# Patient Record
Sex: Female | Born: 2000 | Hispanic: No | Marital: Single | Smoking: Never smoker
Health system: Southern US, Community
[De-identification: ages and names within clinical notes are randomized; demographics above are authoritative.]

## PROBLEM LIST (undated history)

## (undated) DIAGNOSIS — R87619 Unspecified abnormal cytological findings in specimens from cervix uteri: Secondary | ICD-10-CM

## (undated) DIAGNOSIS — S82892A Other fracture of left lower leg, initial encounter for closed fracture: Secondary | ICD-10-CM

## (undated) HISTORY — DX: Other fracture of left lower leg, initial encounter for closed fracture: S82.892A

## (undated) HISTORY — PX: WISDOM TOOTH EXTRACTION: SHX21

## (undated) HISTORY — PX: OTHER SURGICAL HISTORY: SHX169

## (undated) HISTORY — DX: Unspecified abnormal cytological findings in specimens from cervix uteri: R87.619

---
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
## 2019-05-14 ENCOUNTER — Ambulatory Visit (INDEPENDENT_AMBULATORY_CARE_PROVIDER_SITE_OTHER): Payer: 59 | Admitting: Obstetrics and Gynecology

## 2019-05-14 ENCOUNTER — Encounter: Payer: Self-pay | Admitting: Obstetrics and Gynecology

## 2019-05-14 ENCOUNTER — Other Ambulatory Visit: Payer: Self-pay

## 2019-05-14 VITALS — BP 116/74 | Ht 68.0 in | Wt 166.0 lb

## 2019-05-14 DIAGNOSIS — Z30431 Encounter for routine checking of intrauterine contraceptive device: Secondary | ICD-10-CM

## 2019-05-14 DIAGNOSIS — T8332XA Displacement of intrauterine contraceptive device, initial encounter: Secondary | ICD-10-CM | POA: Diagnosis not present

## 2019-05-14 DIAGNOSIS — Z01419 Encounter for gynecological examination (general) (routine) without abnormal findings: Secondary | ICD-10-CM

## 2019-05-14 DIAGNOSIS — Z113 Encounter for screening for infections with a predominantly sexual mode of transmission: Secondary | ICD-10-CM | POA: Diagnosis not present

## 2019-05-14 NOTE — Progress Notes (Signed)
Lauren Wu January 05, 2001 [id number]  SUBJECTIVE:  19 y.o. G0P0000 female for annual routine gynecologic exam.  She has a [name] in place since 04/2017.  The period is getting a little more frequent, light flow for one day, and then a little spotting, enough to need a tampon, but much better than her periods used to be prior to the IUD.  She does request STD screening, although she has no specific concerns and is with her same sexual partner.  She has no gynecologic concerns.  She is not having any pain in the pelvic area.   Current Outpatient Medications  Medication Sig Dispense Refill  . escitalopram (LEXAPRO) 10 MG tablet Take 10 mg by mouth daily.    Marland Kitchen levonorgestrel (MIRENA) 20 MCG/24HR IUD 1 each by Intrauterine route once.    . Multiple Vitamin (MULTIVITAMIN) tablet Take 1 tablet by mouth daily.    . nitrofurantoin (MACRODANTIN) 100 MG capsule Take 100 mg by mouth 4 (four) times daily. With intercourse     No current facility-administered medications for this visit.   Allergies: Patient has no known allergies.  No LMP recorded. (Menstrual status: IUD).  Past medical history,surgical history, problem list, medications, allergies, family history and social history were all reviewed and documented as reviewed in the EPIC chart.  ROS:  Feeling well. No dyspnea or chest pain on exertion.  No abdominal pain, change in bowel habits, black or bloody stools.  No urinary tract symptoms. GYN ROS: normal menses, no abnormal bleeding, pelvic pain or discharge, no breast pain or new or enlarging lumps on self exam. No neurological complaints.   OBJECTIVE:  BP 116/74   Ht 5\' 8"  (1.727 m)   Wt 166 lb (75.3 kg)   BMI 25.24 kg/m  The patient appears well, alert, oriented x 3, in no distress. ENT normal.  Neck supple. No cervical or supraclavicular adenopathy or thyromegaly.  Lungs are clear, good air entry, no wheezes, rhonchi or rales. S1 and S2 normal, no murmurs, regular rate and  rhythm.  Abdomen soft without tenderness, guarding, mass or organomegaly.  Neurological is normal, no focal findings. BREAST EXAM: patient declines PELVIC EXAM: VULVA: normal appearing vulva with no masses, tenderness or lesions, VAGINA: normal appearing vagina with normal color and discharge, no lesions, CERVIX: normal appearing cervix without discharge or lesions, IUD strings not visualized even with the assistance of the endocervical Cytobrush strings were not able to be retrieved, UTERUS: uterus is normal size, shape, consistency and nontender, ADNEXA: normal adnexa in size, nontender and no masses  Bedside transvaginal ultrasound is performed with visualization of the IUD in an appropriate location within the endometrial cavity.  There is no evidence of malpositioning.  Chaperone: Caryn Bee present during the examination and ultrasound  ASSESSMENT:  19 y.o. G0P0000 here for annual gynecologic exam  PLAN:   1. Contraception: [name] in place since 04/2017.  We discussed that it is normal for there to be a change in menstrual pattern the longer the IUD is in place.  The strings were not visualized on visual exam today, so a bedside transvaginal ultrasound was performed confirming intrauterine location of the device. 2. Pap smear will be indicated at age 46. 47. Gardasil series reportedly previously received. 4. Breast health.  Exam not performed today per patient request, recommend breast self-awareness. 5. STI screening.  Gonorrhea chlamydia ThinPrep, blood tests for hepatitis B surface antigen, hepatitis C antibody, RPR, HIV 1 and 2 screening 6. Health maintenance.  No  signs or symptoms to indicate need for routine screening labs at this time, will plan on this when she is in her early 17s.  An additional 15 minutes were spent in this encounter discussing her IUD concerns and evaluating positioning using bedside ultrasound.  Return annually or sooner, prn.  Theresia Majors MD   05/14/19

## 2019-05-14 NOTE — Patient Instructions (Addendum)
The IUD is in a normal location in the uterus based on the ultrasound today.  The strings were not retrievable so when it comes time to remove the device, it may be a little more difficult than normal but we run into this not uncommonly. We will let you know about the results from today's tests when available.

## 2019-05-15 ENCOUNTER — Encounter: Payer: 59 | Admitting: Obstetrics and Gynecology

## 2019-05-15 LAB — GC PROBE AMP THINPREP: N. gonorrhoeae RNA, TMA: NOT DETECTED

## 2019-05-15 LAB — CHLAMYDIA PROBE AMP THINPREP: C. trachomatis RNA, TMA: NOT DETECTED

## 2019-05-18 LAB — HIV ANTIBODY (ROUTINE TESTING W REFLEX): HIV 1&2 Ab, 4th Generation: NONREACTIVE

## 2019-05-18 LAB — HEPATITIS C ANTIBODY
Hepatitis C Ab: NONREACTIVE
SIGNAL TO CUT-OFF: 0.03 (ref <1.00)

## 2019-05-18 LAB — HEPATITIS B SURFACE ANTIGEN: Hepatitis B Surface Ag: NONREACTIVE

## 2019-05-18 LAB — RPR: RPR Ser Ql: NONREACTIVE

## 2020-05-16 ENCOUNTER — Encounter: Payer: 59 | Admitting: Obstetrics and Gynecology

## 2020-05-17 ENCOUNTER — Encounter: Payer: Self-pay | Admitting: Obstetrics and Gynecology

## 2020-05-17 ENCOUNTER — Other Ambulatory Visit: Payer: Self-pay

## 2020-05-17 ENCOUNTER — Ambulatory Visit: Payer: 59 | Admitting: Obstetrics and Gynecology

## 2020-05-17 VITALS — BP 108/70 | HR 80 | Resp 18 | Ht 68.5 in | Wt 135.4 lb

## 2020-05-17 DIAGNOSIS — Z113 Encounter for screening for infections with a predominantly sexual mode of transmission: Secondary | ICD-10-CM | POA: Diagnosis not present

## 2020-05-17 DIAGNOSIS — Z01419 Encounter for gynecological examination (general) (routine) without abnormal findings: Secondary | ICD-10-CM | POA: Diagnosis not present

## 2020-05-17 DIAGNOSIS — N390 Urinary tract infection, site not specified: Secondary | ICD-10-CM | POA: Diagnosis not present

## 2020-05-17 NOTE — Progress Notes (Signed)
Lauren Wu 03-20-2001 [id number]  SUBJECTIVE:  20 y.o. G0P0000 female for annual routine gynecologic exam.  She has a [name] in place since 04/2017.  Regular 3 to 4-day light periods.  She does request STD screening, although she has no specific concerns at this time.  She has no gynecologic concerns.  Has previously had a history of recurrent UTIs, she was [facility] and she said there was some glucose in urine and questionable UTI, she is not having any significant symptoms at this time.  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   Current Outpatient Medications  Medication Sig Dispense Refill  . escitalopram (LEXAPRO) 10 MG tablet Take 10 mg by mouth daily.    Marland Kitchen levonorgestrel (MIRENA) 20 MCG/24HR IUD 1 each by Intrauterine route once.    . Multiple Vitamin (MULTIVITAMIN) tablet Take 1 tablet by mouth daily.    Marland Kitchen tretinoin (RETIN-A) 0.05 % cream Apply to affected area every other night    . nitrofurantoin (MACRODANTIN) 100 MG capsule Take 100 mg by mouth 4 (four) times daily. With intercourse (Patient not taking: Reported on 05/17/2020)     No current facility-administered medications for this visit.   Allergies: Patient has no known allergies.  Patient's last menstrual period was 05/05/2020 (approximate).  Past medical history,surgical history, problem list, medications, allergies, family history and social history were all reviewed and documented as reviewed in the EPIC chart.  ROS: Positives and negatives as reviewed in HPI.   OBJECTIVE:  BP 108/70   Pulse 80   Resp 18   Ht 5' 8.5" (1.74 m)   Wt 135 lb 6.4 oz (61.4 kg)   LMP 05/05/2020 (Approximate)   BMI 20.29 kg/m  The patient appears well, alert, oriented, in no distress. ENT normal.  Neck supple. No cervical or supraclavicular adenopathy or thyromegaly.  Lungs are clear, good air entry, no wheezes, rhonchi or rales. S1 and S2 normal, no murmurs, regular rate and rhythm.   Abdomen soft without tenderness, guarding, mass or organomegaly.  Neurological is normal, no focal findings. BREAST EXAM: Normal breasts bilaterally, normal areola and nipple complex.  No skin dimpling or retractions.  No concerning masses.  No axillary lymphadenopathy. PELVIC EXAM: VULVA: normal appearing vulva with no masses, tenderness or lesions, VAGINA: normal appearing vagina with normal color and discharge, no lesions, CERVIX: normal appearing cervix without discharge or lesions, IUD strings visualized 1 cm outside of the external os, UTERUS: uterus is normal size, shape, consistency and nontender, ADNEXA: normal adnexa in size, nontender and no masses.  GC/Chlamydia probe obtained.  Chaperone: Amity Bing present during the examination   ASSESSMENT:  20 y.o. G0P0000 here for annual gynecologic exam  PLAN:   1. Contraception: [name] in place since 04/2017.  No concerns at this time.  We were able to visualize the IUD strings today.  Due for removal at 5-year mark. 2. Pap smear will be indicated at age 20. 74. Gardasil series reportedly previously received. 4. Breast health.  Recommend breast self-awareness/SBE. 5. STI screening.  Gonorrhea-chlamydia probe collected, serum screening for hepatitis B surface antigen, hepatitis C antibody, RPR, HIV 1 and 2 screening 6.  History of recurrent UTIs.  She does have an established relationship with urologist near her school in [place].  She will plan to follow-up there.  We will run a UA today given the report of possible features suggestive of UTI, and glucosuria on her recent urine testing done at  her school. 7. Health maintenance.  No signs or symptoms to indicate need for routine screening labs at this time.   Return annually or sooner, prn.  Theresia Majors MD  05/17/20

## 2020-05-18 LAB — HIV ANTIBODY (ROUTINE TESTING W REFLEX): HIV 1&2 Ab, 4th Generation: NONREACTIVE

## 2020-05-18 LAB — RPR: RPR Ser Ql: NONREACTIVE

## 2020-05-18 LAB — HEPATITIS B SURFACE ANTIGEN: Hepatitis B Surface Ag: NONREACTIVE

## 2020-05-18 LAB — C. TRACHOMATIS/N. GONORRHOEAE RNA
C. trachomatis RNA, TMA: NOT DETECTED
N. gonorrhoeae RNA, TMA: NOT DETECTED

## 2020-05-18 LAB — HEPATITIS C ANTIBODY
Hepatitis C Ab: NONREACTIVE
SIGNAL TO CUT-OFF: 0.02 (ref <1.00)

## 2020-05-19 LAB — URINALYSIS, COMPLETE W/RFL CULTURE
Bilirubin Urine: NEGATIVE
Glucose, UA: NEGATIVE
Hgb urine dipstick: NEGATIVE
Hyaline Cast: NONE SEEN /LPF
Ketones, ur: NEGATIVE
Leukocyte Esterase: NEGATIVE
Nitrites, Initial: NEGATIVE
Protein, ur: NEGATIVE
RBC / HPF: NONE SEEN /HPF (ref 0–2)
Specific Gravity, Urine: 1.015 (ref 1.001–1.03)
pH: 7 (ref 5.0–8.0)

## 2020-05-19 LAB — URINE CULTURE
MICRO NUMBER:: 11508404
SPECIMEN QUALITY:: ADEQUATE

## 2020-05-19 LAB — CULTURE INDICATED

## 2021-06-01 ENCOUNTER — Ambulatory Visit: Payer: 59 | Admitting: Obstetrics & Gynecology

## 2021-06-26 ENCOUNTER — Other Ambulatory Visit: Payer: Self-pay

## 2021-06-26 ENCOUNTER — Ambulatory Visit (INDEPENDENT_AMBULATORY_CARE_PROVIDER_SITE_OTHER): Payer: 59 | Admitting: Obstetrics & Gynecology

## 2021-06-26 ENCOUNTER — Encounter: Payer: Self-pay | Admitting: Obstetrics & Gynecology

## 2021-06-26 ENCOUNTER — Other Ambulatory Visit (HOSPITAL_COMMUNITY)
Admission: RE | Admit: 2021-06-26 | Discharge: 2021-06-26 | Disposition: A | Discharge status: Home / Self Care | Payer: 59 | Source: Ambulatory Visit | Attending: Obstetrics & Gynecology | Admitting: Obstetrics & Gynecology

## 2021-06-26 VITALS — BP 118/74 | HR 76 | Ht 67.75 in | Wt 166.0 lb

## 2021-06-26 DIAGNOSIS — Z01419 Encounter for gynecological examination (general) (routine) without abnormal findings: Secondary | ICD-10-CM | POA: Diagnosis present

## 2021-06-26 DIAGNOSIS — N898 Other specified noninflammatory disorders of vagina: Secondary | ICD-10-CM

## 2021-06-26 DIAGNOSIS — N3090 Cystitis, unspecified without hematuria: Secondary | ICD-10-CM

## 2021-06-26 DIAGNOSIS — Z113 Encounter for screening for infections with a predominantly sexual mode of transmission: Secondary | ICD-10-CM | POA: Insufficient documentation

## 2021-06-26 DIAGNOSIS — Z30431 Encounter for routine checking of intrauterine contraceptive device: Secondary | ICD-10-CM | POA: Diagnosis not present

## 2021-06-26 LAB — WET PREP FOR TRICH, YEAST, CLUE

## 2021-06-26 MED ORDER — TINIDAZOLE 500 MG PO TABS: 1000.0000 mg | ORAL_TABLET | Freq: Two times a day (BID) | ORAL | 0 refills | Status: AC

## 2021-06-26 NOTE — Progress Notes (Addendum)
? ? ?Lauren Wu 25-Jul-2000 [id number] ? ? ?History:    21 y.o. G0 Single.  Graduating this May from [employer]. ? ?RP:  Established patient presenting for annual gyn exam  ? ?HPI: Well on [name] in place since 04/2017.  No BTB.  No pelvic pain.  Vaginal itching and odor recently.  Treated for BV a few weeks ago.  H/O recurrent Cystitis, seen by Urology last year.  Had Antibioprophylaxis prescribed to take when sexually active.  No UTI since then. Pap reflex/Gono-Chlam today.  Full STI screen desired.  Gardasil series reportedly previously received. Breasts normal. BMI 25.43.  Good fitness. ? ? ?Past medical history,surgical history, family history and social history were all reviewed and documented in the EPIC chart. ? ?Gynecologic History ?Patient's last menstrual period was 06/16/2021 (exact date). ? ?Obstetric History ?OB History  ?Gravida Para Term Preterm AB Living  ?0 0 0 0 0 0  ?SAB IAB Ectopic Multiple Live Births  ?0 0 0 0 0  ? ? ? ?ROS: A ROS was performed and pertinent positives and negatives are included in the history. ? GENERAL: No fevers or chills. HEENT: No change in vision, no earache, sore throat or sinus congestion. NECK: No pain or stiffness. CARDIOVASCULAR: No chest pain or pressure. No palpitations. PULMONARY: No shortness of breath, cough or wheeze. GASTROINTESTINAL: No abdominal pain, nausea, vomiting or diarrhea, melena or bright red blood per rectum. GENITOURINARY: No urinary frequency, urgency, hesitancy or dysuria. MUSCULOSKELETAL: No joint or muscle pain, no back pain, no recent trauma. DERMATOLOGIC: No rash, no itching, no lesions. ENDOCRINE: No polyuria, polydipsia, no heat or cold intolerance. No recent change in weight. HEMATOLOGICAL: No anemia or easy bruising or bleeding. NEUROLOGIC: No headache, seizures, numbness, tingling or weakness. PSYCHIATRIC: No depression, no loss of interest in normal activity or change in sleep pattern.  ?  ? ?Exam: ? ? ?BP 118/74   Pulse 76    Ht 5' 7.75" (1.721 m)   Wt 166 lb (75.3 kg)   LMP 06/16/2021 (Exact Date)   SpO2 99%   BMI 25.43 kg/m?  ? ?Body mass index is 25.43 kg/m?. ? ?General appearance : Well developed well nourished female. No acute distress ?HEENT: Eyes: no retinal hemorrhage or exudates,  Neck supple, trachea midline, no carotid bruits, no thyroidmegaly ?Lungs: Clear to auscultation, no rhonchi or wheezes, or rib retractions  ?Heart: Regular rate and rhythm, no murmurs or gallops ?Breast:Examined in sitting and supine position were symmetrical in appearance, no palpable masses or tenderness,  no skin retraction, no nipple inversion, no nipple discharge, no skin discoloration, no axillary or supraclavicular lymphadenopathy ?Abdomen: no palpable masses or tenderness, no rebound or guarding ?Extremities: no edema or skin discoloration or tenderness ? ?Pelvic: Vulva: Normal ?            Vagina: No gross lesions.  Increased discharge, wet prep done. ? Cervix: No gross lesions or discharge.  [facility].  Pap/Gono-Chlam done. ? Uterus  AV, normal size, shape and consistency, non-tender and mobile ? Adnexa  Without masses or tenderness ? Anus: Normal ? ?Wet prep:  Clue cells and odor present ?U/A Yellow slightly cloudy, Pro Neg, Nit Neg, WBC 0-5, RBC Neg, Bacteria Moderate, few Clue cells.  Pending U. Culture. ? ? ?Assessment/Plan:  21 y.o. female for annual exam  ? ?1. Encounter for routine gynecological examination with Papanicolaou smear of cervix ?Well on [name] in place since 04/2017.  No BTB.  No  pelvic pain.  Vaginal itching and odor recently.  Treated for BV a few weeks ago.  H/O recurrent Cystitis, seen by Urology last year.  Had Antibioprophylaxis prescribed to take when sexually active.  No UTI since then. Pap reflex/Gono-Chlam today.  Full STI screen desired.  Gardasil series reportedly previously received. Breasts normal. BMI 25.43.  Good fitness. ?- Cytology - PAP( [place]) ? ?2. Encounter for routine  checking of intrauterine contraceptive device (IUD) ?Well on [name] x 04/2017.  IUD in good position.  No CI to continue.   ? ?3. Screen for STD (sexually transmitted disease) ?Strict condom use recommended. ?- HIV antibody (with reflex) ?- RPR ?- Hepatitis B Surface AntiGEN ?- Hepatitis C Antibody ?- Cytology - PAP( [place])- Gono-Chlam ? ?4. Vaginal discharge ?BV confirmed on Wet prep.  Will treat with Tinidazole.  Usage reviewed and prescription sent to pharmacy. ?- WET PREP FOR TRICH, YEAST, CLUE ? ?5. H/O Recurrent cystitis ?U/A mildly abnormal, probably d/t BV.  Treating BV.  Will wait on Urine Culture results. ?- Urinalysis,Complete w/RFL Culture ? ?Other orders ?- SPIRONOLACTONE PO; Take 10 mg by mouth.  ?- tinidazole (TINDAMAX) 500 MG tablet; Take 2 tablets (1,000 mg total) by mouth 2 (two) times daily for 2 days.  ? ?Genia Del MD, 10:07 AM 06/26/2021 ? ?  ?

## 2021-06-27 LAB — HEPATITIS B SURFACE ANTIGEN: Hepatitis B Surface Ag: NONREACTIVE

## 2021-06-27 LAB — RPR: RPR Ser Ql: NONREACTIVE

## 2021-06-27 LAB — HEPATITIS C ANTIBODY
Hepatitis C Ab: NONREACTIVE
SIGNAL TO CUT-OFF: 0.08 (ref <1.00)

## 2021-06-27 LAB — HIV ANTIBODY (ROUTINE TESTING W REFLEX): HIV 1&2 Ab, 4th Generation: NONREACTIVE

## 2021-06-29 LAB — URINE CULTURE
MICRO NUMBER:: 13152046
SPECIMEN QUALITY:: ADEQUATE

## 2021-06-29 LAB — URINALYSIS, COMPLETE W/RFL CULTURE
Bilirubin Urine: NEGATIVE
Casts: NONE SEEN /LPF
Crystals: NONE SEEN /HPF
Glucose, UA: NEGATIVE
Hgb urine dipstick: NEGATIVE
Hyaline Cast: NONE SEEN /LPF
Ketones, ur: NEGATIVE
Leukocyte Esterase: NEGATIVE
Nitrites, Initial: NEGATIVE
Protein, ur: NEGATIVE
RBC / HPF: NONE SEEN /HPF (ref 0–2)
Specific Gravity, Urine: 1.02 (ref 1.001–1.035)
Yeast: NONE SEEN /HPF
pH: 7.5 (ref 5.0–8.0)

## 2021-06-29 LAB — CULTURE INDICATED

## 2021-07-03 LAB — CYTOLOGY - PAP
Chlamydia: NEGATIVE
Comment: NEGATIVE
Comment: NEGATIVE
Comment: NORMAL
Diagnosis: UNDETERMINED — AB
High risk HPV: POSITIVE — AB
Neisseria Gonorrhea: NEGATIVE

## 2021-07-04 ENCOUNTER — Other Ambulatory Visit: Payer: Self-pay

## 2021-07-04 MED ORDER — NITROFURANTOIN MONOHYD MACRO 100 MG PO CAPS: 100.0000 mg | ORAL_CAPSULE | Freq: Two times a day (BID) | ORAL | 0 refills | Status: DC

## 2021-08-01 ENCOUNTER — Telehealth: Payer: Self-pay | Admitting: *Deleted

## 2021-08-01 NOTE — Telephone Encounter (Signed)
Dr. Dellis Filbert -please review PAP dated 06/26/21.  ?

## 2021-08-07 NOTE — Telephone Encounter (Signed)
Spoke with patient, advised of PAP results per Dr. Seymour Bars, questions answered. OV scheduled for 12/12/21 at 1:30 pm for repeat PAP. Patient verbalizes understanding and is agreeable.  ? ?6 mo recall placed.  ? ?Encounter closed.  ?

## 2021-08-07 NOTE — Telephone Encounter (Signed)
Lauren Bruins, MD  You 4 days ago  ? ?Given that she is 21 yo with ASCUS/HPV HR Pos, repeat Pap in 6 months.   ? ?

## 2021-12-12 ENCOUNTER — Encounter: Payer: Self-pay | Admitting: Obstetrics & Gynecology

## 2021-12-12 ENCOUNTER — Other Ambulatory Visit (HOSPITAL_COMMUNITY)
Admission: RE | Admit: 2021-12-12 | Discharge: 2021-12-12 | Disposition: A | Discharge status: Home / Self Care | Payer: 59 | Source: Ambulatory Visit | Attending: Obstetrics & Gynecology | Admitting: Obstetrics & Gynecology

## 2021-12-12 ENCOUNTER — Ambulatory Visit (INDEPENDENT_AMBULATORY_CARE_PROVIDER_SITE_OTHER): Payer: 59 | Admitting: Obstetrics & Gynecology

## 2021-12-12 VITALS — BP 120/76 | HR 68 | Resp 16

## 2021-12-12 DIAGNOSIS — R87610 Atypical squamous cells of undetermined significance on cytologic smear of cervix (ASC-US): Secondary | ICD-10-CM | POA: Diagnosis not present

## 2021-12-12 DIAGNOSIS — N898 Other specified noninflammatory disorders of vagina: Secondary | ICD-10-CM

## 2021-12-12 DIAGNOSIS — Z113 Encounter for screening for infections with a predominantly sexual mode of transmission: Secondary | ICD-10-CM

## 2021-12-12 DIAGNOSIS — R87810 Cervical high risk human papillomavirus (HPV) DNA test positive: Secondary | ICD-10-CM | POA: Insufficient documentation

## 2021-12-12 LAB — WET PREP FOR TRICH, YEAST, CLUE

## 2021-12-12 MED ORDER — TINIDAZOLE 500 MG PO TABS: 1000.0000 mg | ORAL_TABLET | Freq: Two times a day (BID) | ORAL | 0 refills | Status: AC

## 2021-12-12 NOTE — Progress Notes (Signed)
    Dlisa Barnwell Suen 03/28/2001 [id number]        21 y.o.  G0P0000   RP: Repeat Pap, ASCUS/HPV HR Pos 06/26/21  HPI: ASCUS/HPV HR Pos 06/26/21. Full STI screen otherwise Neg on 06/26/21. Gardasil series reportedly previously received. C/O vaginal discharge with odor.  Well on [name] in place since 04/2017.  No BTB.  No pelvic pain. H/O recurrent Cystitis, seen by Urology last year.  Had Antibioprophylaxis prescribed to take when sexually active.  No UTI since then.     OB History  Gravida Para Term Preterm AB Living  0 0 0 0 0 0  SAB IAB Ectopic Multiple Live Births  0 0 0 0 0    Past medical history,surgical history, problem list, medications, allergies, family history and social history were all reviewed and documented in the EPIC chart.   Directed ROS with pertinent positives and negatives documented in the history of present illness/assessment and plan.  Exam:  Vitals:   12/12/21 1333  BP: 120/76  Pulse: 68  Resp: 16   General appearance:  Normal  Abdomen: Normal  Gynecologic exam: Vulva normal.  Speculum:  Cervix normal.  IUD strings visible at EO.  Pap/HPV HR, Gono-Chlam done. Increased discharge.  Wet prep done.    Wet Prep:  Clue cells present with odor   Assessment/Plan:  21 y.o. G0  1. ASCUS with positive high risk HPV cervical ASCUS/HPV HR Pos 06/26/21. Full STI screen otherwise Neg on 06/26/21. Gardasil series reportedly previously received. C/O vaginal discharge with odor.  Well on [name] in place since 04/2017.  No BTB.  No pelvic pain. H/O recurrent Cystitis, seen by Urology last year.  Had Antibioprophylaxis prescribed to take when sexually active.  No UTI since then.   Pap/HPV HR today.  Management per results. - Cytology - PAP( [place])  2. Screen for STD (sexually transmitted disease) - Cytology - PAP( [place])  3. Vaginal discharge Bacterial Vaginosis.  Will treat with Tinidazole. Usage reviewed and prescription sent to pharmacy.   Probiotics/Boric Acid recommended for prophylaxis. - WET PREP FOR TRICH, YEAST, CLUE  Other orders - tinidazole (TINDAMAX) 500 MG tablet; Take 2 tablets (1,000 mg total) by mouth 2 (two) times daily for 2 days.   Genia Del MD, 1:38 PM 12/12/2021

## 2021-12-18 LAB — CYTOLOGY - PAP
Chlamydia: NEGATIVE
Comment: NEGATIVE
Comment: NEGATIVE
Comment: NORMAL
Diagnosis: NEGATIVE
Diagnosis: REACTIVE
High risk HPV: NEGATIVE
Neisseria Gonorrhea: NEGATIVE

## 2022-06-28 ENCOUNTER — Ambulatory Visit (INDEPENDENT_AMBULATORY_CARE_PROVIDER_SITE_OTHER): Payer: 59 | Admitting: Obstetrics & Gynecology

## 2022-06-28 ENCOUNTER — Other Ambulatory Visit (HOSPITAL_COMMUNITY)
Admission: RE | Admit: 2022-06-28 | Discharge: 2022-06-28 | Disposition: A | Discharge status: Home / Self Care | Payer: 59 | Source: Ambulatory Visit | Attending: Obstetrics & Gynecology | Admitting: Obstetrics & Gynecology

## 2022-06-28 ENCOUNTER — Encounter: Payer: Self-pay | Admitting: Obstetrics & Gynecology

## 2022-06-28 VITALS — BP 120/80 | HR 74 | Ht 67.75 in | Wt 190.0 lb

## 2022-06-28 DIAGNOSIS — Z01419 Encounter for gynecological examination (general) (routine) without abnormal findings: Secondary | ICD-10-CM | POA: Insufficient documentation

## 2022-06-28 DIAGNOSIS — R87810 Cervical high risk human papillomavirus (HPV) DNA test positive: Secondary | ICD-10-CM | POA: Diagnosis present

## 2022-06-28 DIAGNOSIS — R87610 Atypical squamous cells of undetermined significance on cytologic smear of cervix (ASC-US): Secondary | ICD-10-CM

## 2022-06-28 DIAGNOSIS — Z113 Encounter for screening for infections with a predominantly sexual mode of transmission: Secondary | ICD-10-CM

## 2022-06-28 DIAGNOSIS — Z30431 Encounter for routine checking of intrauterine contraceptive device: Secondary | ICD-10-CM

## 2022-06-28 DIAGNOSIS — N760 Acute vaginitis: Secondary | ICD-10-CM | POA: Diagnosis not present

## 2022-06-28 DIAGNOSIS — N898 Other specified noninflammatory disorders of vagina: Secondary | ICD-10-CM

## 2022-06-28 LAB — WET PREP FOR TRICH, YEAST, CLUE

## 2022-06-28 MED ORDER — TINIDAZOLE 500 MG PO TABS: 1000.0000 mg | ORAL_TABLET | Freq: Two times a day (BID) | ORAL | 2 refills | Status: AC

## 2022-06-28 MED ORDER — FLUCONAZOLE 150 MG PO TABS: 150.0000 mg | ORAL_TABLET | ORAL | 2 refills | Status: AC

## 2022-06-28 NOTE — Progress Notes (Signed)
Lauren Wu 2000-08-31 [id number]   History:    22 y.o.  G0 Single/stable partner.  Graduated from [employer].     RP:  Established patient presenting for annual gyn exam    HPI: Well on [name] in place since 04/2017.  Light spaced menses.  No pelvic pain. Increased vaginal discharge.  Pap Neg/HPV HR Neg/Gono-Chlam Neg in 12/2021.  Pap ASCUS/HPV HR Pos in 06/2021.  ASCUS/HPV HR Pos 06/26/21. Pap/HPV HR/Gono-Chlam today.  Gardasil series reportedly previously received.  Breasts normal. BMI increased to 29.1.  Good fitness.  Health labs with Fam MD.  Will repeat a TSH in 6 months as it was wnl, but higher than before.     Past medical history,surgical history, family history and social history were all reviewed and documented in the EPIC chart.  Gynecologic History No LMP recorded. (Menstrual status: IUD).  Obstetric History OB History  Gravida Para Term Preterm AB Living  0 0 0 0 0 0  SAB IAB Ectopic Multiple Live Births  0 0 0 0 0     ROS: A ROS was performed and pertinent positives and negatives are included in the history. GENERAL: No fevers or chills. HEENT: No change in vision, no earache, sore throat or sinus congestion. NECK: No pain or stiffness. CARDIOVASCULAR: No chest pain or pressure. No palpitations. PULMONARY: No shortness of breath, cough or wheeze. GASTROINTESTINAL: No abdominal pain, nausea, vomiting or diarrhea, melena or bright red blood per rectum. GENITOURINARY: No urinary frequency, urgency, hesitancy or dysuria. MUSCULOSKELETAL: No joint or muscle pain, no back pain, no recent trauma. DERMATOLOGIC: No rash, no itching, no lesions. ENDOCRINE: No polyuria, polydipsia, no heat or cold intolerance. No recent change in weight. HEMATOLOGICAL: No anemia or easy bruising or bleeding. NEUROLOGIC: No headache, seizures, numbness, tingling or weakness. PSYCHIATRIC: No depression, no loss of interest in normal activity or change in sleep pattern.     Exam:   BP  120/80   Pulse 74   Ht 5' 7.75" (1.721 m)   Wt 190 lb (86.2 kg)   BMI 29.10 kg/m   Body mass index is 29.1 kg/m.  General appearance : Well developed well nourished female. No acute distress HEENT: Eyes: no retinal hemorrhage or exudates,  Neck supple, trachea midline, no carotid bruits, no thyroidmegaly Lungs: Clear to auscultation, no rhonchi or wheezes, or rib retractions  Heart: Regular rate and rhythm, no murmurs or gallops Breast:Examined in sitting and supine position were symmetrical in appearance, no palpable masses or tenderness,  no skin retraction, no nipple inversion, no nipple discharge, no skin discoloration, no axillary or supraclavicular lymphadenopathy Abdomen: no palpable masses or tenderness, no rebound or guarding Extremities: no edema or skin discoloration or tenderness  Pelvic: Vulva: Normal             Vagina: No gross lesions.  Increased discharge.  Wet prep done.  Cervix: No gross lesions or discharge.  [facility]UD strings visible at [facility].  Pap/HPV HR/Gono-Chlam done.  Uterus  AV, normal size, shape and consistency, non-tender and mobile  Adnexa  Without masses or tenderness  Anus: Normal  Wet Prep:  Clue cells present with odor and yeast   Assessment/Plan:  22 y.o. female for annual exam   1. Encounter for routine gynecological examination with Papanicolaou smear of cervix Well on [name] in place since 04/2017.  Light spaced menses.  No pelvic pain. Increased vaginal discharge.  Pap Neg/HPV HR Neg/Gono-Chlam Neg in 12/2021.  Pap ASCUS/HPV HR  Pos in 06/2021.  ASCUS/HPV HR Pos 06/26/21. Pap/HPV HR/Gono-Chlam today.  Gardasil series reportedly previously received.  Breasts normal. BMI increased to 29.1.  Good fitness.  Health labs with Fam MD.  Will repeat a TSH in 6 months as it was wnl, but higher than before.  - Cytology - PAP( [place])  2. ASCUS with positive high risk HPV cervical - Cytology - PAP( [place])  3. Encounter for routine checking of  intrauterine contraceptive device (IUD) Well on [name] in place since 04/2017.  Light spaced menses.  No pelvic pain. IUD in good position.  4. Vaginal discharge BV and yeast confirmed by Wet prep.  Will treat with Tinidazole, followed by Fluconazole. - WET PREP FOR [place], YEAST, CLUE  5. Screen for STD (sexually transmitted disease) - HIV antibody (with reflex) - RPR - Hepatitis B Surface AntiGEN - Hepatitis C Antibody  Other orders - spironolactone (ALDACTONE) 50 MG tablet; Take by mouth. - tinidazole (TINDAMAX) 500 MG tablet; Take 2 tablets (1,000 mg total) by mouth 2 (two) times daily for 2 days. - fluconazole (DIFLUCAN) 150 MG tablet; Take 1 tablet (150 mg total) by mouth every other day for 3 doses.   Princess Bruins MD, 10:22 AM

## 2022-06-29 LAB — HEPATITIS B SURFACE ANTIGEN: Hepatitis B Surface Ag: NONREACTIVE

## 2022-06-29 LAB — RPR: RPR Ser Ql: NONREACTIVE

## 2022-06-29 LAB — HIV ANTIBODY (ROUTINE TESTING W REFLEX): HIV 1&2 Ab, 4th Generation: NONREACTIVE

## 2022-06-29 LAB — HEPATITIS C ANTIBODY: Hepatitis C Ab: NONREACTIVE

## 2022-07-04 LAB — CYTOLOGY - PAP
Chlamydia: NEGATIVE
Comment: NEGATIVE
Comment: NEGATIVE
Comment: NORMAL
Diagnosis: NEGATIVE
High risk HPV: NEGATIVE
Neisseria Gonorrhea: NEGATIVE

## 2023-10-25 ENCOUNTER — Ambulatory Visit: Payer: 59 | Admitting: Obstetrics and Gynecology

## 2023-11-06 ENCOUNTER — Encounter: Payer: Self-pay | Admitting: Obstetrics and Gynecology

## 2023-11-06 ENCOUNTER — Other Ambulatory Visit (HOSPITAL_COMMUNITY)
Admission: RE | Admit: 2023-11-06 | Discharge: 2023-11-06 | Disposition: A | Discharge status: Home / Self Care | Source: Ambulatory Visit | Attending: Obstetrics and Gynecology | Admitting: Obstetrics and Gynecology

## 2023-11-06 ENCOUNTER — Ambulatory Visit (INDEPENDENT_AMBULATORY_CARE_PROVIDER_SITE_OTHER): Admitting: Obstetrics and Gynecology

## 2023-11-06 VITALS — BP 116/70 | HR 71 | Temp 97.9°F | Ht 68.25 in | Wt 200.0 lb

## 2023-11-06 DIAGNOSIS — Z124 Encounter for screening for malignant neoplasm of cervix: Secondary | ICD-10-CM

## 2023-11-06 DIAGNOSIS — N898 Other specified noninflammatory disorders of vagina: Secondary | ICD-10-CM

## 2023-11-06 DIAGNOSIS — Z01419 Encounter for gynecological examination (general) (routine) without abnormal findings: Secondary | ICD-10-CM | POA: Diagnosis not present

## 2023-11-06 DIAGNOSIS — Z8742 Personal history of other diseases of the female genital tract: Secondary | ICD-10-CM

## 2023-11-06 DIAGNOSIS — Z87440 Personal history of urinary (tract) infections: Secondary | ICD-10-CM

## 2023-11-06 DIAGNOSIS — R8290 Unspecified abnormal findings in urine: Secondary | ICD-10-CM | POA: Diagnosis not present

## 2023-11-06 DIAGNOSIS — N760 Acute vaginitis: Secondary | ICD-10-CM

## 2023-11-06 DIAGNOSIS — Z113 Encounter for screening for infections with a predominantly sexual mode of transmission: Secondary | ICD-10-CM

## 2023-11-06 DIAGNOSIS — Z1331 Encounter for screening for depression: Secondary | ICD-10-CM | POA: Diagnosis not present

## 2023-11-06 DIAGNOSIS — Z975 Presence of (intrauterine) contraceptive device: Secondary | ICD-10-CM

## 2023-11-06 MED ORDER — METRONIDAZOLE 500 MG PO TABS: 500.0000 mg | ORAL_TABLET | Freq: Two times a day (BID) | ORAL | 0 refills | Status: AC
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
## 2023-11-06 NOTE — Patient Instructions (Signed)

## 2023-11-07 ENCOUNTER — Ambulatory Visit: Payer: Self-pay | Admitting: Obstetrics and Gynecology

## 2023-11-07 DIAGNOSIS — B3731 Acute candidiasis of vulva and vagina: Secondary | ICD-10-CM

## 2023-11-07 LAB — SURESWAB® ADVANCED VAGINITIS PLUS,TMA
C. trachomatis RNA, TMA: NOT DETECTED
CANDIDA SPECIES: DETECTED — AB
Candida glabrata: NOT DETECTED
N. gonorrhoeae RNA, TMA: NOT DETECTED
SURESWAB(R) ADV BACTERIAL VAGINOSIS(BV),TMA: POSITIVE — AB
TRICHOMONAS VAGINALIS (TV),TMA: NOT DETECTED

## 2023-11-07 LAB — HEPATITIS C ANTIBODY: Hepatitis C Ab: NONREACTIVE

## 2023-11-07 LAB — HIV ANTIBODY (ROUTINE TESTING W REFLEX): HIV 1&2 Ab, 4th Generation: NONREACTIVE

## 2023-11-07 LAB — HSV 2 ANTIBODY, IGG: HSV 2 Glycoprotein G Ab, IgG: <0.9 {index}

## 2023-11-07 LAB — HEPATITIS B SURFACE ANTIGEN: Hepatitis B Surface Ag: NONREACTIVE

## 2023-11-07 LAB — RPR: RPR Ser Ql: NONREACTIVE

## 2023-11-08 LAB — URINALYSIS, COMPLETE W/RFL CULTURE
Bilirubin Urine: NEGATIVE
Glucose, UA: NEGATIVE
Hgb urine dipstick: NEGATIVE
Hyaline Cast: NONE SEEN /LPF
Ketones, ur: NEGATIVE
Leukocyte Esterase: NEGATIVE
Nitrites, Initial: NEGATIVE
Protein, ur: NEGATIVE
RBC / HPF: NONE SEEN /HPF (ref 0–2)
Specific Gravity, Urine: 1.02 (ref 1.001–1.035)
pH: 8.5 — ABNORMAL HIGH (ref 5.0–8.0)

## 2023-11-08 LAB — CULTURE INDICATED

## 2023-11-08 LAB — URINE CULTURE
MICRO NUMBER:: 16764808
Result:: NO GROWTH
SPECIMEN QUALITY:: ADEQUATE

## 2023-11-08 MED ORDER — FLUCONAZOLE 150 MG PO TABS: 150.0000 mg | ORAL_TABLET | Freq: Once | ORAL | 1 refills | Status: AC

## 2023-11-08 NOTE — Assessment & Plan Note (Signed)
 Mirena  inserted 05/07/2017 Effective for 8 years

## 2023-11-08 NOTE — Assessment & Plan Note (Signed)
 Cervical cancer screening performed according to ASCCP guidelines. Labs and immunizations with her primary Encouraged safe sexual practices as indicated Encouraged healthy lifestyle practices with diet and exercise For patients under 24yo, I recommend 1000mg  calcium daily and 600IU of vitamin D daily.

## 2023-11-18 LAB — CYTOLOGY - PAP
Comment: NEGATIVE
Diagnosis: UNDETERMINED — AB
High risk HPV: NEGATIVE

## 2024-11-06 ENCOUNTER — Ambulatory Visit: Admitting: Obstetrics and Gynecology
